# Patient Record
Sex: Female | Born: 1985 | Race: White | Hispanic: No | Marital: Single | Smoking: Never smoker
Health system: Southern US, Community
[De-identification: ages and names within clinical notes are randomized; demographics above are authoritative.]

## PROBLEM LIST (undated history)

## (undated) DIAGNOSIS — E785 Hyperlipidemia, unspecified: Secondary | ICD-10-CM

## (undated) DIAGNOSIS — K219 Gastro-esophageal reflux disease without esophagitis: Secondary | ICD-10-CM

## (undated) DIAGNOSIS — G4730 Sleep apnea, unspecified: Secondary | ICD-10-CM

## (undated) DIAGNOSIS — J45909 Unspecified asthma, uncomplicated: Secondary | ICD-10-CM

## (undated) DIAGNOSIS — N3010 Interstitial cystitis (chronic) without hematuria: Secondary | ICD-10-CM

## (undated) HISTORY — DX: Hyperlipidemia, unspecified: E78.5

## (undated) HISTORY — DX: Gastro-esophageal reflux disease without esophagitis: K21.9

---

## 2012-04-12 MED ORDER — ARIPIPRAZOLE 5 MG PO TABS
5 MG | ORAL_TABLET | Freq: Every day | ORAL | Status: DC
Start: 2012-04-12 — End: 2012-08-25

## 2012-04-13 MED ORDER — SYMBICORT 160-4.5 MCG/ACT IN AERO
Freq: Two times a day (BID) | RESPIRATORY_TRACT | Status: AC
Start: 2012-04-13 — End: ?

## 2012-04-13 NOTE — Telephone Encounter (Signed)
Message copied by Delorise Shiner on Thu Apr 13, 2012  9:44 AM  ------       Message from: Emeline Darling       Created: Wed Apr 12, 2012  7:20 PM       Contact: [facility] [phone]         Refill symbacort 160 inhaler 1 unit 1 puff bid              [facility] [address]     Zip- [phone]  ------

## 2012-04-13 NOTE — Telephone Encounter (Signed)
Med refill

## 2012-04-14 NOTE — Telephone Encounter (Signed)
Error

## 2012-05-17 MED ORDER — LEVALBUTEROL TARTRATE 45 MCG/ACT IN AERO
45 MCG/ACT | RESPIRATORY_TRACT | Status: DC
Start: 2012-05-17 — End: 2012-07-17

## 2012-05-17 MED ORDER — ALBUTEROL SULFATE HFA 108 (90 BASE) MCG/ACT IN AERS
108 (90 Base) MCG/ACT | Freq: Four times a day (QID) | RESPIRATORY_TRACT | Status: AC
Start: 2012-05-17 — End: ?

## 2012-07-17 LAB — POCT URINALYSIS DIPSTICK W/O MICROSCOPE (AUTO)
Bilirubin, UA: NEGATIVE
Blood, UA POC: NEGATIVE
Glucose, UA POC: NEGATIVE
Ketones, UA: NEGATIVE
Leukocytes, UA: NEGATIVE
Nitrite, UA: NEGATIVE
Protein, UA POC: NEGATIVE
Spec Grav, UA: 1.025
pH, UA: 5.5

## 2012-07-17 NOTE — Progress Notes (Signed)
Subjective:      Patient ID: Carol Woodard is a 26 y.o. female.    HPI Well Adult Exam  Feels good, no complaints    Review of Systems   Constitutional: Negative.    HENT: Negative.    Eyes: Negative.    Respiratory: Negative.    Cardiovascular: Negative.    Gastrointestinal: Negative.    Endocrine: Negative.    Genitourinary: Negative.    Musculoskeletal: Negative.    Skin: Negative.    Allergic/Immunologic: Negative.    Neurological: Negative.    Hematological: Negative.    Psychiatric/Behavioral: Negative.        Objective:   Physical Exam   Nursing note and vitals reviewed.  Constitutional: She is oriented to person, place, and time. She appears well-developed and well-nourished.   HENT:   Head: Normocephalic and atraumatic.   Right Ear: External ear normal.   Left Ear: External ear normal.   Nose: Nose normal.   Mouth/Throat: Oropharynx is clear and moist.   Eyes: Conjunctivae and EOM are normal. Pupils are equal, round, and reactive to light.   Neck: Normal range of motion. Neck supple.   Cardiovascular: Normal rate, regular rhythm, normal heart sounds and intact distal pulses.    Pulmonary/Chest: Effort normal and breath sounds normal.   Abdominal: Soft. Bowel sounds are normal.   Musculoskeletal: Normal range of motion.   Neurological: She is alert and oriented to person, place, and time.   Skin: Skin is warm and dry.   Psychiatric: She has a normal mood and affect. Her behavior is normal. Judgment and thought content normal.       Assessment:      Well Adult Exam      Plan:      Discussed diet, exercise  Discussed all lab

## 2012-07-17 NOTE — Patient Instructions (Signed)
Thank you for enrolling in MyChart. Please follow the instructions below to securely access your online medical record. MyChart allows you to send messages to your doctor, view your test results, renew your prescriptions, schedule appointments, and more.     How Do I Sign Up?  1. In your Internet browser, go to [email or web address]  2. Click on the Sign Up Now link in the Sign In box. You will see the New Member Sign Up page.  3. Enter your MyChart Access Code exactly as it appears below. You will not need to use this code after you???ve completed the sign-up process. If you do not sign up before the expiration date, you must request a new code.  MyChart Access Code: [email or web address]  Expires: 09/15/2012  4:10 PM    4. Enter your Social Security Number ([id number]) and Date of Birth (mm/dd/yyyy) as indicated and click Submit. You will be taken to the next sign-up page.  5. Create a MyChart ID. This will be your MyChart login ID and cannot be changed, so think of one that is secure and easy to remember.  6. Create a MyChart password. You can change your password at any time.  7. Enter your Password Reset Question and Answer. This can be used at a later time if you forget your password.   8. Enter your e-mail address. You will receive e-mail notification when new information is available in MyChart.  9. Click Sign Up. You can now view your medical record.     Additional Information  If you have questions, please contact your physician practice where you receive care. Remember, MyChart is NOT to be used for urgent needs. For medical emergencies, dial 911.

## 2012-07-18 NOTE — Telephone Encounter (Signed)
gave lab results per dr kolp

## 2012-07-23 MED ORDER — DROSPIRENONE-ETHINYL ESTRADIOL 3-0.02 MG PO TABS
ORAL_TABLET | Freq: Every day | ORAL | Status: DC
Start: 2012-07-23 — End: 2013-02-10

## 2012-08-26 MED ORDER — ARIPIPRAZOLE 5 MG PO TABS
5 MG | ORAL_TABLET | Freq: Every day | ORAL | Status: DC
Start: 2012-08-26 — End: 2012-10-25

## 2012-09-04 MED ORDER — MONTELUKAST SODIUM 10 MG PO TABS
10 MG | ORAL_TABLET | Freq: Every evening | ORAL | Status: AC
Start: 2012-09-04 — End: ?

## 2012-10-05 MED ORDER — CEFDINIR 300 MG PO CAPS
300 MG | ORAL_CAPSULE | Freq: Two times a day (BID) | ORAL | Status: AC
Start: 2012-10-05 — End: 2012-10-15

## 2012-10-05 NOTE — Telephone Encounter (Signed)
Sinus congestion, sneezing, green drainage, cough for 4 days. She is in [place] and wants to know if you will call in an ATB?

## 2012-10-25 MED ORDER — ARIPIPRAZOLE 5 MG PO TABS
5 MG | ORAL_TABLET | Freq: Every day | ORAL | Status: DC
Start: 2012-10-25 — End: 2013-02-12

## 2012-10-25 MED ORDER — ESCITALOPRAM OXALATE 20 MG PO TABS
20 MG | ORAL_TABLET | ORAL | Status: DC
Start: 2012-10-25 — End: 2013-03-05

## 2012-10-25 NOTE — Telephone Encounter (Signed)
I refaxed script. Patient said pharmacy didn't get refill.

## 2012-11-16 NOTE — Progress Notes (Signed)
Subjective:      Patient ID: Carol Woodard is a 26 y.o. female.    HPI anxiety check    Review of Systems   Constitutional: Negative.    HENT: Negative.    Eyes: Negative.    Respiratory: Negative.    Cardiovascular: Negative.    Gastrointestinal: Negative.    Endocrine: Negative.    Genitourinary: Negative.    Musculoskeletal: Negative.    Skin: Negative.    Allergic/Immunologic: Negative.    Neurological: Negative.    Hematological: Negative.    Psychiatric/Behavioral: Negative.        Objective:   Physical Exam   Nursing note and vitals reviewed.  Constitutional: She is oriented to person, place, and time. She appears well-developed and well-nourished.   HENT:   Head: Normocephalic and atraumatic.   Right Ear: External ear normal.   Left Ear: External ear normal.   Nose: Nose normal.   Mouth/Throat: Oropharynx is clear and moist.   Eyes: Conjunctivae and EOM are normal. Pupils are equal, round, and reactive to light.   Neck: Normal range of motion. Neck supple.   Cardiovascular: Normal rate, regular rhythm, S1 normal, S2 normal, normal heart sounds, intact distal pulses and normal pulses.  Exam reveals no gallop.    No murmur heard.  Pulmonary/Chest: Breath sounds normal. She has no decreased breath sounds. She has no wheezes. She has no rhonchi. She has no rales.   Abdominal: Soft. Bowel sounds are normal.   Musculoskeletal: Normal range of motion.   Neurological: She is alert and oriented to person, place, and time. She has normal reflexes.   Skin: Skin is warm and dry.   Psychiatric: She has a normal mood and affect. Her behavior is normal. Judgment and thought content normal.       Assessment:      Anxiety  Asthma      Plan:      Cont meds  Discussed weight gain--exercise, foods

## 2013-01-19 NOTE — Telephone Encounter (Signed)
Recent medical records from last 1-66yrs copied & mailed to [facility] [place], [place]

## 2013-02-12 MED ORDER — LORYNA 3-0.02 MG PO TABS
ORAL_TABLET | ORAL | Status: DC
Start: 2013-02-12 — End: 2013-09-02

## 2013-02-13 MED ORDER — ARIPIPRAZOLE 5 MG PO TABS
5 MG | ORAL_TABLET | Freq: Every day | ORAL | Status: DC
Start: 2013-02-13 — End: 2013-09-04

## 2013-03-05 MED ORDER — ESCITALOPRAM OXALATE 20 MG PO TABS
20 MG | ORAL_TABLET | ORAL | Status: DC
Start: 2013-03-05 — End: 2013-07-03

## 2013-07-03 MED ORDER — ESCITALOPRAM OXALATE 20 MG PO TABS
20 MG | ORAL_TABLET | ORAL | Status: AC
Start: 2013-07-03 — End: ?

## 2013-09-04 MED ORDER — ARIPIPRAZOLE 5 MG PO TABS
5 MG | ORAL_TABLET | Freq: Every day | ORAL | Status: DC
Start: 2013-09-04 — End: 2013-10-02

## 2013-09-04 MED ORDER — LORYNA 3-0.02 MG PO TABS
ORAL_TABLET | ORAL | Status: AC
Start: 2013-09-04 — End: ?

## 2013-10-03 MED ORDER — ARIPIPRAZOLE 5 MG PO TABS
5 MG | ORAL_TABLET | Freq: Every day | ORAL | Status: AC
Start: 2013-10-03 — End: ?

## 2013-10-03 NOTE — Telephone Encounter (Signed)
Pt needs a med check appointment in the next 2 months

## 2014-04-17 NOTE — Telephone Encounter (Signed)
Pt called to see if she had her T-dap.  She doesn't know if she had it or not.  Her sister is having a baby soon.

## 2014-04-17 NOTE — Telephone Encounter (Signed)
Message printed

## 2014-04-18 NOTE — Telephone Encounter (Signed)
TDaP was done 11/18/2009. Pt was called and given info

## 2016-08-10 ENCOUNTER — Other Ambulatory Visit: Payer: Self-pay | Admitting: Physician Assistant

## 2016-08-10 DIAGNOSIS — R319 Hematuria, unspecified: Secondary | ICD-10-CM

## 2016-08-11 ENCOUNTER — Ambulatory Visit
Admission: RE | Admit: 2016-08-11 | Discharge: 2016-08-11 | Disposition: A | Discharge status: Home / Self Care | Payer: BLUE CROSS/BLUE SHIELD | Source: Ambulatory Visit | Attending: Physician Assistant | Admitting: Physician Assistant

## 2016-08-11 DIAGNOSIS — R319 Hematuria, unspecified: Secondary | ICD-10-CM

## 2016-12-09 ENCOUNTER — Ambulatory Visit (HOSPITAL_COMMUNITY)
Admission: EM | Admit: 2016-12-09 | Discharge: 2016-12-09 | Disposition: A | Discharge status: Home / Self Care | Payer: 59 | Attending: Emergency Medicine | Admitting: Emergency Medicine

## 2016-12-09 ENCOUNTER — Encounter (HOSPITAL_COMMUNITY): Payer: Self-pay | Admitting: Emergency Medicine

## 2016-12-09 DIAGNOSIS — J0100 Acute maxillary sinusitis, unspecified: Secondary | ICD-10-CM | POA: Diagnosis not present

## 2016-12-09 DIAGNOSIS — R05 Cough: Secondary | ICD-10-CM | POA: Diagnosis not present

## 2016-12-09 DIAGNOSIS — R059 Cough, unspecified: Secondary | ICD-10-CM

## 2016-12-09 HISTORY — DX: Unspecified asthma, uncomplicated: J45.909

## 2016-12-09 HISTORY — DX: Sleep apnea, unspecified: G47.30

## 2016-12-09 HISTORY — DX: Interstitial cystitis (chronic) without hematuria: N30.10

## 2016-12-09 MED ORDER — IPRATROPIUM BROMIDE 0.06 % NA SOLN: 2.0000 | Freq: Four times a day (QID) | NASAL | 0 refills | Status: AC

## 2016-12-09 MED ORDER — BENZONATATE 100 MG PO CAPS: 200.0000 mg | ORAL_CAPSULE | Freq: Three times a day (TID) | ORAL | 0 refills | Status: AC | PRN

## 2016-12-09 MED ORDER — AMOXICILLIN-POT CLAVULANATE 875-125 MG PO TABS: 1.0000 | ORAL_TABLET | Freq: Two times a day (BID) | ORAL | 0 refills | Status: AC

## 2016-12-09 NOTE — ED Triage Notes (Signed)
Pt c/o cold sx onset: 2 weeks  Sx include: facial pressure, ST, bilateral ear pain, HA, sneezing, dry cough, fevers  Taking: OTC Mucinex w/temp relief.   A&O x4... NAD

## 2016-12-09 NOTE — ED Provider Notes (Signed)
CSN: [id number]     Arrival date & time 12/09/16  1558 History   First MD Initiated Contact with Patient 12/09/16 1734     Chief Complaint  Patient presents with  . URI   (Consider location/radiation/quality/duration/timing/severity/associated sxs/prior Treatment) C/o fever and facial pain and uri sx's for last 2 weeks.   The history is provided by the patient.  URI  Presenting symptoms: congestion, cough, facial pain, fatigue, fever and rhinorrhea   Severity:  Moderate Onset quality:  Gradual Duration:  2 weeks Timing:  Constant Progression:  Worsening Chronicity:  New Relieved by:  Nothing Worsened by:  Nothing   Past Medical History:  Diagnosis Date  . Asthma   . Interstitial cystitis   . Sleep apnea    History reviewed. No pertinent surgical history. History reviewed. No pertinent family history. Social History  Substance Use Topics  . Smoking status: Never Smoker  . Smokeless tobacco: Never Used  . Alcohol use No   OB History    No data available     Review of Systems  Constitutional: Positive for fatigue and fever.  HENT: Positive for congestion and rhinorrhea.   Eyes: Negative.   Respiratory: Positive for cough.   Cardiovascular: Negative.   Gastrointestinal: Negative.   Endocrine: Negative.   Genitourinary: Negative.   Musculoskeletal: Negative.   Allergic/Immunologic: Negative.   Neurological: Negative.   Hematological: Negative.   Psychiatric/Behavioral: Negative.     Allergies  Patient has no known allergies.  Home Medications   Prior to Admission medications   Medication Sig Start Date End Date Taking? Authorizing Provider  albuterol (PROVENTIL HFA;VENTOLIN HFA) 108 (90 Base) MCG/ACT inhaler Inhale into the lungs every 6 (six) hours as needed for wheezing or shortness of breath.   Yes Historical Provider, MD  ARIPiprazole (ABILIFY) 5 MG tablet Take 5 mg by mouth daily.   Yes Historical Provider, MD  drospirenone-ethinyl estradiol  (YAZ,GIANVI,LORYNA) 3-0.02 MG tablet Take 1 tablet by mouth daily.   Yes Historical Provider, MD  escitalopram (LEXAPRO) 20 MG tablet Take 20 mg by mouth daily.   Yes Historical Provider, MD  Mirabegron (MYRBETRIQ PO) Take by mouth.   Yes Historical Provider, MD  montelukast (SINGULAIR) 10 MG tablet Take 10 mg by mouth at bedtime.   Yes Historical Provider, MD  amoxicillin-clavulanate (AUGMENTIN) 875-125 MG tablet Take 1 tablet by mouth 2 (two) times daily. 12/09/16   Deatra CanterWilliam J Mercy Malena Laske, FNP  benzonatate (TESSALON) 100 MG capsule Take 2 capsules (200 mg total) by mouth 3 (three) times daily as needed for cough. 12/09/16   Deatra CanterWilliam J Lynnox Girten, FNP  ipratropium (ATROVENT) 0.06 % nasal spray Place 2 sprays into both nostrils 4 (four) times daily. 12/09/16   Deatra CanterWilliam J Hezekiah Veltre, FNP   Meds Ordered and Administered this Visit  Medications - No data to display  BP 126/80 (BP Location: Left Arm)   Pulse 107   Temp 98.7 F (37.1 C) (Oral)   Resp 20   LMP 11/18/2016   SpO2 100%  No data found.   Physical Exam  Constitutional: She appears well-developed and well-nourished.  HENT:  Head: Normocephalic and atraumatic.  Right Ear: External ear normal.  Left Ear: External ear normal.  Nose: Nose normal.  Mouth/Throat: Oropharynx is clear and moist.  Eyes: Conjunctivae and EOM are normal. Pupils are equal, round, and reactive to light.  Neck: Normal range of motion. Neck supple.  Cardiovascular: Normal rate, regular rhythm and normal heart sounds.   Pulmonary/Chest: Effort normal and breath  sounds normal.  Nursing note and vitals reviewed.   Urgent Care Course     Procedures (including critical care time)  Labs Review Labs Reviewed - No data to display  Imaging Review No results found.   Visual Acuity Review  Right Eye Distance:   Left Eye Distance:   Bilateral Distance:    Right Eye Near:   Left Eye Near:    Bilateral Near:         MDM   1. Subacute maxillary sinusitis   2.  Cough    Augmentin 875mg  one po bid x 10 days #20 Ipratropium nasal spray 0.06% 2 sprays per nostril tid prn #59ml Push po fluids, rest, tylenol and motrin otc prn as directed for fever, arthralgias, and myalgias.  Follow up prn if sx's continue or persist.    Deatra Canter, FNP 12/09/16 1740

## 2017-04-22 ENCOUNTER — Ambulatory Visit
Admission: RE | Admit: 2017-04-22 | Discharge: 2017-04-22 | Disposition: A | Discharge status: Home / Self Care | Payer: 59 | Source: Ambulatory Visit | Attending: Physician Assistant | Admitting: Physician Assistant

## 2017-04-22 ENCOUNTER — Other Ambulatory Visit: Payer: Self-pay | Admitting: Physician Assistant

## 2017-04-22 DIAGNOSIS — R509 Fever, unspecified: Secondary | ICD-10-CM

## 2017-04-22 DIAGNOSIS — R05 Cough: Secondary | ICD-10-CM

## 2017-04-22 DIAGNOSIS — R059 Cough, unspecified: Secondary | ICD-10-CM

## 2017-05-03 ENCOUNTER — Other Ambulatory Visit (HOSPITAL_COMMUNITY): Payer: Self-pay | Admitting: Otolaryngology

## 2017-05-03 DIAGNOSIS — J320 Chronic maxillary sinusitis: Secondary | ICD-10-CM

## 2017-05-06 ENCOUNTER — Ambulatory Visit (HOSPITAL_COMMUNITY)
Admission: RE | Admit: 2017-05-06 | Discharge: 2017-05-06 | Disposition: A | Discharge status: Home / Self Care | Payer: 59 | Source: Ambulatory Visit | Attending: Otolaryngology | Admitting: Otolaryngology

## 2017-05-06 DIAGNOSIS — J322 Chronic ethmoidal sinusitis: Secondary | ICD-10-CM | POA: Insufficient documentation

## 2017-05-06 DIAGNOSIS — J320 Chronic maxillary sinusitis: Secondary | ICD-10-CM | POA: Diagnosis present

## 2017-05-06 DIAGNOSIS — J342 Deviated nasal septum: Secondary | ICD-10-CM | POA: Insufficient documentation

## 2017-08-15 ENCOUNTER — Encounter: Payer: 59 | Attending: Physician Assistant | Admitting: Registered"

## 2017-08-15 ENCOUNTER — Encounter: Payer: Self-pay | Admitting: Registered"

## 2017-08-15 DIAGNOSIS — R7303 Prediabetes: Secondary | ICD-10-CM | POA: Diagnosis not present

## 2017-08-15 DIAGNOSIS — Z713 Dietary counseling and surveillance: Secondary | ICD-10-CM | POA: Diagnosis not present

## 2017-08-15 NOTE — Patient Instructions (Addendum)
Make sure to get in three meals per day to ensure your body is getting the nutrition it needs, prevent becoming overly hungry, and to promote healthy metabolism. Try to eat balanced meals like the plate example (see handout).   Try to practice mindful eating by having meals at a table without electronics present to promote listening to your body's hunger and satiety cues.   Recommend keeping a food and symptom journal to help assess which foods worsen acid reflux and interstitial cystitis. Common bladder irritants -include: carbonated beverages, caffeine in all forms (including chocolate), citrus products. Consider avoiding similar foods, such as tomatoes, pickled foods, alcohol and spices. Artificial sweeteners may aggravate symptoms in some people.   Try to get in regular physical activity-try to work toward getting in at least 30 minutes 5 days per week/150 minutes per week of physical activity. This can help with blood sugar management.

## 2017-08-15 NOTE — Progress Notes (Signed)
Medical Nutrition Therapy:  Appt start time: 1410 end time:  1520.   Assessment:  Primary concerns today: Pt referred for prediabetes, GERD, and weight management. Pt also says she has been dx with interstitial cystitis. Pt says she would like to know what she should eat to be healthier. Pt says she eats out most of the time. She says she has been trying to exercise more, but does not feel she is very good as doing so. Pt joined a walking club a couple weeks ago, but says she doesn't really enjoy it and she often hurts a lot afterward. 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 Pt says it is difficult for her to eat lunch while she is working, but she recently sent in her notice and will soon be working from home. Pt lives by herself.   Noted labs:   05/04/17:  HbA1c: 6.3  Food allergies/intolerances: lactose intolerant   Preferred Learning Style:  No preference indicated   Learning Readiness:  Ready  MEDICATIONS: See list.    DIETARY INTAKE:  Usual eating pattern includes 2-3 meals and 0 snacks per day. Pt says it is difficult for her to always stop to eat lunch when at work. Pt says she feels when she does eat a meal her portions are too large. Meals at home are either eaten at the table or on her bed. Electronics are always present during mealtimes. Pt says she usually gets home from work around 6-7 pm.   Everyday foods include Chick fil a chicken bagel 3-4 times per week for breakfast.  Avoided foods include milk, ice cream.     24-hr recall: Typical weekend B (AM): PB&J sandwich on white bread, 1% Lactaid milk Snk (11 AM): 1/2 box cheese crackers  L ( PM): None reported.  Snk ( PM): None reported.  D ( 5:30PM): [employer] beef, broccoli, brown rice, water   Snk ( 8-9PM): Large piece of chocolate cake, latte with soy milk  Beverages: 1% milk, water, latte   24-hr recall: Typical Weekday B (AM): Chick fil a bagel with fried chicken, cheese, egg,  coffee  Snk (11 AM):  L ( PM): Chips from vending machine OR food from [employer]  Snk ( PM): None reported.  D ( 5:30PM): Triscits, salsa and cheese OR something out-seafood pasta with clams, shrimp, pasta,  Snk ( PM): None reported.  Beverages: water   Alcohol 2-3 times per week-typically 1 glass of wine or a cocktail.   Usual physical activity: Pt says she joined a walking club and has been walking 2 times/week around 20-30 minutes each time for 4.5 miles.  Progress Towards Goal(s):  In progress.   Nutritional Diagnosis:  NI-5.11.1 Predicted suboptimal nutrient intake As related to skipping meals and unbalanced diet low in fruits, vegetables, and whole grains .  As evidenced by pt's reported dietary recall and habits .    Intervention:  Nutrition counseling provided. Dietitian discussed pt's labs and provided education on the relationship between insulin resistance and dietary intake and the benefits of physical activity on blood sugar management. Dietitian provided education on balanced nutrition and the importance of getting in regular meals. Provided education on mindful eating and how skipping meals can make one overly hungry and likely to want to overeat at next meal. Dietitian discussed the importance of focusing on balanced nutrition and getting in regular physical activity rather than focusing on weight. Dietitian provided education on GERD nutrition therapy and discussed  foods that can worsen bladder irritation with interstitial cystitis. Discussed cause of lactose intolerance. Encouraged pt to keep a food/symptom log to help determine foods that may exacerbate GERD and interstitial cystitis. Pt appeared agreeable to information/goals discussed.   Goals:   Make sure to get in three meals per day to ensure your body is getting the nutrition it needs, prevent becoming overly hungry, and to promote healthy metabolism. Try to eat balanced meals like the plate example (see  handout).   Try to practice mindful eating by having meals at a table without electronics present to promote listening to your body's hunger and satiety cues.   Recommend keeping a food and symptom journal to help assess which foods worsen acid reflux and interstitial cystitis. Common bladder irritants -include: carbonated beverages, caffeine in all forms (including chocolate), citrus products. Consider avoiding similar foods, such as tomatoes, pickled foods, alcohol and spices. Artificial sweeteners may aggravate symptoms in some people.   Try to get in regular physical activity-try to work toward getting in at least 30 minutes 5 days per week/150 minutes per week of physical activity. This can help with blood sugar management.   Teaching Method Utilized:  Visual Auditory  Handouts given during visit include:  Living Well with Diabetes   Balanced plate with food list   Carbohydrate/protein snack list   GERD Nutrition Therapy   Barriers to learning/adherence to lifestyle change: None indicated.   Demonstrated degree of understanding via:  Teach Back   Monitoring/Evaluation:  Dietary intake, exercise, and body weight in 2 month(s).

## 2017-10-17 ENCOUNTER — Encounter: Payer: 59 | Attending: Physician Assistant | Admitting: Registered"

## 2017-10-17 ENCOUNTER — Encounter: Payer: Self-pay | Admitting: Registered"

## 2017-10-17 DIAGNOSIS — Z713 Dietary counseling and surveillance: Secondary | ICD-10-CM | POA: Diagnosis not present

## 2017-10-17 DIAGNOSIS — R7303 Prediabetes: Secondary | ICD-10-CM | POA: Insufficient documentation

## 2017-10-17 NOTE — Patient Instructions (Addendum)
Make sure to get in three meals per day. Try to have balanced meals like the My Plate example (see handout). Try to include more vegetables, fruits, and whole grains at meals.   To help with having more balanced meals at lunch recommend meal planning for lunches during the week and putting food in containers you can easily grab during lunchtime.   For snacks-try to include a good source of protein with snacks to balance out carbohydrates   Continue working to make physical activity a regular part of your week. Try to include at least 30 minutes of physical activity 5 days each week or at least 150 minutes per week. Regular physical activity promotes overall health-including helping to reduce risk for heart disease and diabetes, promoting mental health, and helping us sleep better.    Recommend gradually adding in another day of activity until reaching activity on at least 5 days or 150 minutes per week.   Recommend taking Nature Made Fish Oil Supplement-1000 mg omega 3 daily.

## 2017-10-17 NOTE — Progress Notes (Signed)
Medical Nutrition Therapy:  Appt start time: 1730 end time:  1815.   Assessment:  Primary concerns today: Pt referred for prediabetes, GERD, and weight management. Pt also says she has been dx with interstitial cystitis. Nutrition Follow-Up: Pt reports she had blood work completed since last visit which showed elevated triglycerides and cholesterol. Pt reports her doctor would like for her to start taking medication to improve her blood lipids, but she is not sure she wants to take the medication. She plans to talk more with her doctor about medication prescribed. Pt reports she is still involved in the walking club and they walk about 2 times per week for 30 minutes. Pt reports she is now working from home, but still struggles to eat meals consistently due to having a large work load. Pt typically has something for lunch, but feels she must eat at her desk and cannot take time off to eat uninterrupted. Pt reports her GERD has improved with medication.   Noted labs:  05/04/17:  HbA1c: 6.3  Food allergies/intolerances: lactose intolerant   Preferred Learning Style:  No preference indicated   Learning Readiness:  Ready  MEDICATIONS: See list.    DIETARY INTAKE:  Usual eating pattern includes 2-3 meals and 0-1 snacks per day. Electronics are always present during mealtimes.   Everyday foods include Chick fil a chicken bagel 3-4 times per week for breakfast.  Avoided foods include milk, ice cream.      24-hr recall: Pt reports she typically will eat something for breakfast, but she skipped on this day due to not having time to go grocery shopping after the holiday weekend.  B (AM): coffee with almond milk Snk (AM): None reported.  L ( PM): [employer]-grilled chicken skewer, egg plant, cucumber yogurt sauce with pita chips; large coconut macaroon cookie  Snk ( PM): None reported.  D ( PM): Planned- burrito from [employer]  Snk (PM): None reported.  Beverages: ~56-64 oz  water, 1 cup  coffee  Usual physical activity: Pt is still participating in a walking club and has been walking 2 times/week around 30 minutes per week.   Progress Towards Goal(s):  Some progress.   Nutritional Diagnosis:  NI-5.11.1 Predicted suboptimal nutrient intake As related to skipping meals and unbalanced diet low in fruits, vegetables, and whole grains .  As evidenced by pt's reported dietary recall and habits .    Intervention:  Nutrition counseling provided. Dietitian discussed importance of getting in regular meals and how skipping meals or having snacks for meals can cause us to become overly hungry as well as to have carbohydrate cravings later in the day. Discussed strategies for avoiding skipping lunch due to busy work load-meal planning easy to grab meals ahead of time to have ready in the refrigerator. Discussed pairing protein with carbohydrate snacks. Encouraged pt to continue getting in regular activity-discussed improvements pt feels since starting to be more active (having more energy, etc) and how being more active can help improve blood sugar management. Encouraged pt to continue working to increase number of days she includes activity. Recommended pt take a fish oil supplement of 1000 mg omega 3 to help with eleveated triglyceride levels. Pt appeared agreeable to information/goals discussed.   Goals:   Make sure to get in three meals per day. Try to have balanced meals like the My Plate example (see handout). Try to include more vegetables, fruits, and whole grains at meals.   To help with having more balanced meals at lunch  recommend meal planning for lunches during the week and putting food in containers you can easily grab during lunchtime.   For snacks-try to include a good source of protein with snacks to balance out carbohydrates   Continue working to make physical activity a regular part of your week. Try to include at least 30 minutes of physical activity 5 days each week or at  least 150 minutes per week. Regular physical activity promotes overall health-including helping to reduce risk for heart disease and diabetes, promoting mental health, and helping us sleep better.    Recommend gradually adding in another day of activity until reaching activity on at least 5 days or 150 minutes per week.   Recommend taking Nature Made Fish Oil Supplement-1000 mg of omega 3 daily.   Teaching Method Utilized:  Visual Auditory  Barriers to learning/adherence to lifestyle change: None indicated.   Demonstrated degree of understanding via:  Teach Back   Monitoring/Evaluation:  Dietary intake, exercise, and body weight prn.

## 2018-08-08 IMAGING — DX DG CHEST 2V
2 series · 2 of 2 positions shown · non-contrast
Comparison: None.

CLINICAL DATA: Patient with cough for 3 weeks.  Fever.

EXAM:
CHEST  2 VIEW

[dg chest 2 view (1 of 2)]
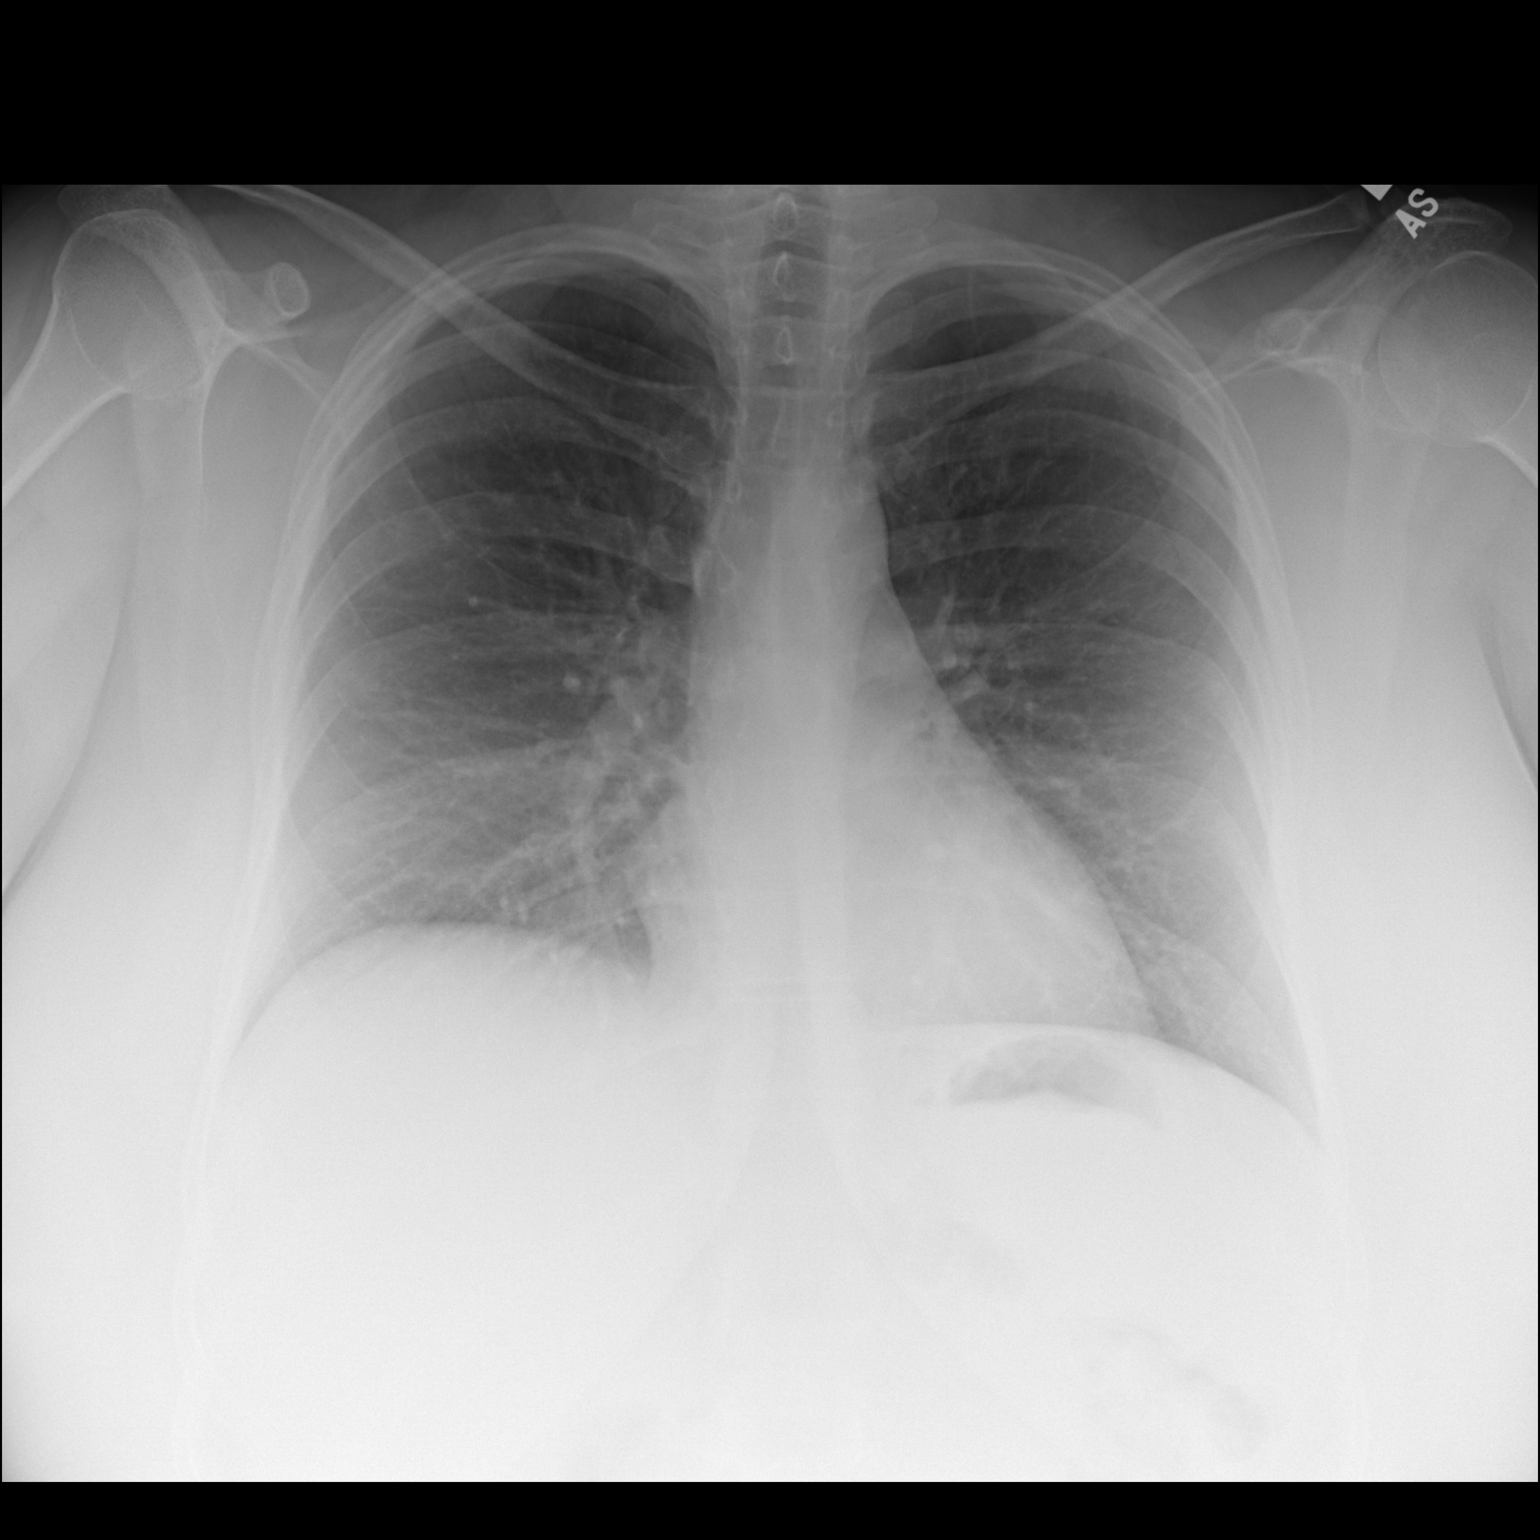

[dg chest 2 view (2 of 2)]
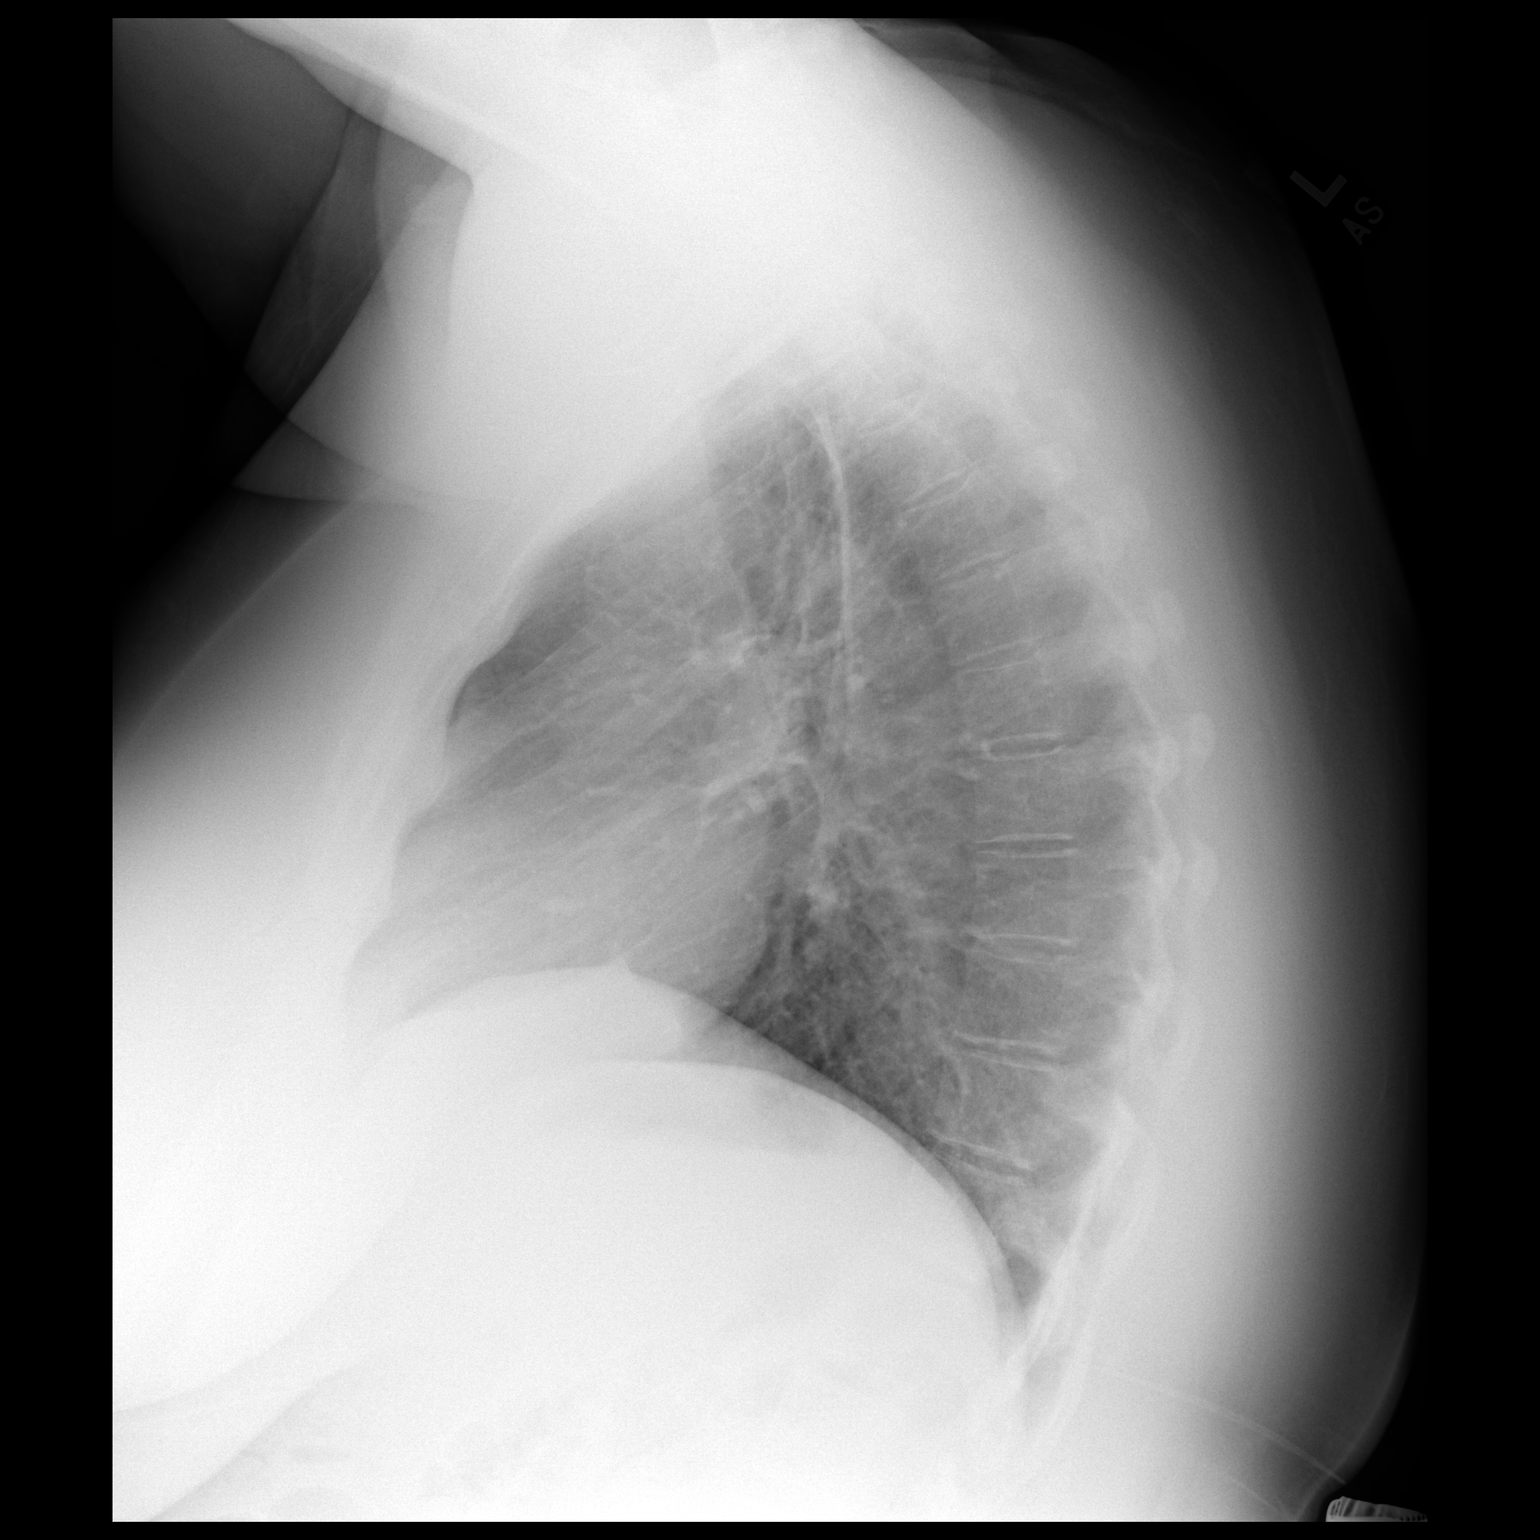

[2 of 2 positions shown; findings below may reference images not displayed]

FINDINGS: Normal cardiac and mediastinal contours. No consolidative pulmonary
opacities. No pleural effusion or pneumothorax. Regional skeleton is
unremarkable.
IMPRESSION: No active cardiopulmonary disease.
